# Patient Record
Sex: Female | Born: 1985 | Hispanic: No | Marital: Married | Smoking: Never smoker
Health system: Southern US, Community
[De-identification: ages and names within clinical notes are randomized; demographics above are authoritative.]

## PROBLEM LIST (undated history)

## (undated) DIAGNOSIS — G43909 Migraine, unspecified, not intractable, without status migrainosus: Secondary | ICD-10-CM

## (undated) HISTORY — DX: Migraine, unspecified, not intractable, without status migrainosus: G43.909

---

## 2020-12-05 ENCOUNTER — Other Ambulatory Visit: Payer: Self-pay | Admitting: Nurse Practitioner

## 2020-12-05 DIAGNOSIS — Z8679 Personal history of other diseases of the circulatory system: Secondary | ICD-10-CM

## 2021-01-09 ENCOUNTER — Other Ambulatory Visit: Payer: Self-pay

## 2021-01-09 ENCOUNTER — Ambulatory Visit
Admission: RE | Admit: 2021-01-09 | Discharge: 2021-01-09 | Disposition: A | Discharge status: Home / Self Care | Payer: Self-pay | Source: Ambulatory Visit | Attending: Nurse Practitioner | Admitting: Nurse Practitioner

## 2021-01-09 DIAGNOSIS — Z8679 Personal history of other diseases of the circulatory system: Secondary | ICD-10-CM

## 2021-01-09 MED ORDER — GADOBENATE DIMEGLUMINE 529 MG/ML IV SOLN
14.0000 mL | Freq: Once | INTRAVENOUS | Status: AC | PRN
  Administered 2021-01-09: 17:00:00 14 mL via INTRAVENOUS

## 2021-01-17 ENCOUNTER — Ambulatory Visit: Payer: 59 | Attending: Family Medicine | Admitting: Physical Therapy

## 2021-01-17 ENCOUNTER — Other Ambulatory Visit: Payer: Self-pay

## 2021-01-17 ENCOUNTER — Encounter: Payer: Self-pay | Admitting: Physical Therapy

## 2021-01-17 DIAGNOSIS — R278 Other lack of coordination: Secondary | ICD-10-CM | POA: Insufficient documentation

## 2021-01-17 DIAGNOSIS — M25531 Pain in right wrist: Secondary | ICD-10-CM | POA: Insufficient documentation

## 2021-01-17 DIAGNOSIS — R252 Cramp and spasm: Secondary | ICD-10-CM | POA: Insufficient documentation

## 2021-01-17 NOTE — Therapy (Signed)
[facility] @ [facility] [address] [place], [place], [address] Phone: [phone]   Fax:  [phone]  Physical Therapy Evaluation  Patient Details  Name: Robin Cooper MRN: [id number] Date of Birth: 1985/07/09 Referring Provider (PT): Lewis Moccasin, MD   Encounter Date: 01/17/2021   PT End of Session - 01/17/21 1705     Visit Number 1    Date for PT Re-Evaluation 04/11/21    Authorization Type [employer]    PT Start Time 1623    PT Stop Time 1700    PT Time Calculation (min) 37 min    Activity Tolerance Patient tolerated treatment well    Behavior During Therapy [facility] for tasks assessed/performed             No past medical history on file.  Past Surgical History:  Procedure Laterality Date   CESAREAN SECTION      There were no vitals filed for this visit.    Subjective Assessment - 01/17/21 1628     Subjective Had c-section in May and was having tingling in the incision that comes and goes.  Having leakage with coughing and sneezing. I am also having dull wrist pain that comes and goes.    Patient Stated Goals stop leakage and no have pain in wrist    Currently in Pain? No/denies                [facility] PT Assessment - 01/18/21 0001       Assessment   Medical Diagnosis N99.89 (ICD-10-CM) - Other postprocedural complications and disorders of genitourinary system;N39.3 (ICD-10-CM) - Stress incontinence (female) (female    Referring Provider (PT) Lewis Moccasin, MD    Hand Dominance Right      Precautions   Precautions None      Balance Screen   Has the patient fallen in the past 6 months No      Home Environment   Living Environment Private residence      Prior Function   Level of Independence Independent    Vocation Full time employment    Vocation Requirements sitting at a desk      Cognition   Overall Cognitive Status Within Functional Limits for tasks assessed      Functional Tests    Functional tests Single leg stance      Single Leg Stance   Comments normal      Posture/Postural Control   Posture/Postural Control Postural limitations    Postural Limitations Increased thoracic kyphosis;Anterior pelvic tilt      ROM / Strength   AROM / PROM / Strength AROM;PROM;Strength      AROM   Overall AROM Comments lumbar flexion 60^      PROM   Overall PROM Comments hip flexion and IR 70% bil      Strength   Overall Strength Comments bil hip abduction, adduction, ext - 4/5; core weakness with diastasis of 1 finger      Flexibility   Soft Tissue Assessment /Muscle Length yes    Hamstrings 80% bil      Palpation   Palpation comment DRA 1 finger throughout      Ambulation/Gait   Gait Pattern Within Functional Limits                         Objective measurements completed on examination: See above findings.     Pelvic Floor Special Questions - 01/18/21 0001  Are you Pregnant or attempting pregnancy? No    Number of Pregnancies 1    Number of C-Sections 1    Currently Sexually Active Yes    Is this Painful No    Urinary Leakage Yes    How often a little    Pad use liner    Urinary urgency No    Urinary frequency normal    Fecal incontinence No    Falling out feeling (prolapse) No    External Palpation little to no movement palpated outside clothing for patient comfort    Exam Type Deferred                        PT Education - 01/17/21 1722     Education Details Access Code: [email or web address]    Person(s) Educated Patient    Methods Explanation;Demonstration;Tactile cues;Handout;Verbal cues    Comprehension Verbalized understanding;Returned demonstration                 PT Long Term Goals - 01/18/21 0827       PT LONG TERM GOAL #1   Title Pt will be ind with advanced HEP    Time 12    Period Weeks    Status New    Target Date 04/11/21      PT LONG TERM GOAL #2   Title Pt will report no leakage and not  needing to use panty liners due to improved pelvic floor strength    Time 12    Period Weeks    Status New    Target Date 04/11/21      PT LONG TERM GOAL #3   Title Pt will be able to bulge and relax the pelvic floor muscles for improved muscle function    Time 12    Period Weeks    Status New    Target Date 04/11/21      PT LONG TERM GOAL #4   Title Pt will report at least 50% reduced abdominal discomfort so she can return to regular exercise routine.    Time 12    Period Weeks    Status New    Target Date 04/11/21                    Plan - 01/17/21 1707     Clinical Impression Statement Pt presents to clinic due to stress incontinence and feeling some discomfort in abdomen and Rt wrist.  Pt is s/p c-section May 2022.  She has recently moved from [place] after the baby was born.  Pt has a lot of limited mobility in bil h/s, hip flexion and IR, and lumbar flexion all limited.  Pt has weakness in bil hip abduction, adduction and extension.  She has slight diastasis rectus abdominus.  Pt deferred an internal assessment today but external palpation was done to assess and she was unable to bulge or contract much.  Pt has tenderness to the Rt flexor digitorum muscles.  pt will benefit from skilled PT to address all the above mentioned impairments and return to maximum function.    Personal Factors and Comorbidities Comorbidity 1    Comorbidities c-section    Examination-Activity Limitations Continence    Examination-Participation Restrictions Community Activity;Occupation    Stability/Clinical Decision Making Stable/Uncomplicated    Clinical Decision Making Low    Rehab Potential Excellent    PT Frequency 1x / week    PT Duration 12 weeks    PT Treatment/Interventions  ADLs/Self Care Home Management;Biofeedback;Cryotherapy;Electrical Stimulation;Moist Heat;Neuromuscular re-education;Therapeutic exercise;Therapeutic activities;Patient/family education;Manual techniques;Dry  needling;Passive range of motion;Taping    PT Next Visit Plan DN to lumbar maybe or STM, core strength, review intial stretches and breathing and bulging, STM to gluteals    PT Home Exercise Plan Access Code: [email or web address]    Consulted and Agree with Plan of Care Patient             Patient will benefit from skilled therapeutic intervention in order to improve the following deficits and impairments:  Decreased coordination, Decreased strength, Postural dysfunction, Pain, Decreased range of motion  Visit Diagnosis: Cramp and spasm  Other lack of coordination  Pain in right wrist     Problem List There are no problems to display for this patient.   Junious Silk, PT 01/18/2021, 10:20 AM  [facility] @ [facility] [address] [place], [place], [address] Phone: [phone]   Fax:  [phone]  Name: Robin Cooper MRN: [id number] Date of Birth: 1985-07-27

## 2021-01-17 NOTE — Patient Instructions (Signed)
Access Code: [email or web address] URL: [email or web address] Date: 01/17/2021 Prepared by: Dwana Curd  Exercises Supine Lower Trunk Rotation - 1 x daily - 7 x weekly - 1 sets - 10 reps - 5 sec hold Supine Hip Internal and External Rotation - 1 x daily - 7 x weekly - 1 sets - 10 reps - 5 sec hold Child's Pose Stretch - 1 x daily - 7 x weekly - 1 sets - 3 reps - 30-60 hold  Patient Education Trigger Point Dry Needling

## 2021-01-18 ENCOUNTER — Ambulatory Visit: Payer: 59 | Admitting: Physical Therapy

## 2023-02-28 ENCOUNTER — Encounter: Payer: Self-pay | Admitting: Neurology

## 2023-07-04 ENCOUNTER — Ambulatory Visit: Payer: 59 | Admitting: Neurology

## 2023-07-09 NOTE — Progress Notes (Unsigned)
 NEUROLOGY CONSULTATION NOTE  Robin Cooper MRN: [id number] DOB: Dec 26, 1985  Referring provider: Rogene Claude, MD Primary care provider: Rayma Calandra, MD  Reason for consult:  migraines  Assessment/Plan:   Menstrual migraine History of PRES during childbirth   Will try perimenstrual prophylaxis:  Samples of Nurtec - take 1 tablet daily for 2 weeks beginning one week prior to menses.  She will give me an update. Migraine rescue:  ibuprofen Limit use of pain relievers to no more than 9 days out of the month to prevent risk of rebound or medication-overuse headache. Keep headache diary Follow up 6 months.   Total time spent in chart and face to face with patient:  45 minutes   Subjective:  Robin Cooper is a 38 year old right-handed female who presents for migraines.  History supplemented by referring provider's note.  MRI of brain personally reviewed.  Onset:  Since childbirth in May 2022.  Pregnancy was complicated on day of delivery by hypertensive emergency with PRES syndrome and subdural hematoma.  Of note, she was found to have COVID when admitted to the hospital.  Headaches lasted for 2 weeks after delivery. Location:  varies - right or left sided, radiate into jaw/teeth Quality:  pressure, sometimes pounding Intensity:  Varies.  Usually 5-7/10, 8/10 at worse Aura:  absent Prodrome:  absent Associated symptoms:  Photophobia, difficulty focusing, rarely nausea .  She denies associated vomiting, phonophobia, visual symptoms, unilateral numbness or weakness. Duration:  4 days to 1 week (intermittent or persistent)  Frequency:  every month beginning 1 week to 2 days before prior to onset of menses. Frequency of abortive medication: no more than a week Triggers:  menstrual cycle Relieving factors:  Advil Activity:  movement aggravates  06/23/2020:  CT HEAD:  Abnormal attenuation in the occipital lobes and cerebellar hemispheres bilaterally is concerning for  acute to subacute ischemia.  Correlation with possible PRESS.  A punctate area of intra-axial hemorrhage in the left cerebellar hemisphere is not excluded. 06/24/2020 MRV BRAIN/HEAD WO:  Absence of flow-related signal on the reconstructed images in the posterior sagittal sinus favored to be artifactual given the recent normal CT venogram.   01/09/2021 MRI BRAIN WO:  Normal examination.  Complete resolution of previous para falcine hematoma without imaging residua.  Past NSAIDS/analgesics:  Tylenol Extra-strength Past abortive triptans:  none Past abortive ergotamine:  none Past muscle relaxants:  none Past anti-emetic:  none Past antihypertensive medications:  for a short period after during and after delivery of her son Past antidepressant medications:  none Past anticonvulsant medications:  none Past anti-CGRP:  none Past vitamins/Herbal/Supplements:  none Past antihistamines/decongestants:  none Other past therapies:  none  Current NSAIDS/analgesics:  ibuprofen Current triptans:  none Current ergotamine:  none Current anti-emetic:  none Current muscle relaxants:  none Current Antihypertensive medications:  none Current Antidepressant medications:  none Current Anticonvulsant medications:  none Current anti-CGRP:  none Current Vitamins/Herbal/Supplements:  MVI Current Antihistamines/Decongestants:  none Other therapy:  none Birth control:  none    Caffeine:  1 to 2 cups coffee daily.  No soda or energy drinks Alcohol:  no more than 1-2 glasses wine a week Smoker:  no Diet:  at least 50-60 oz water daily.  Protein, vegetables, trying to reduce carbs, limits eating out.   Exercise:  routine prior to pregnancy.  Not routine caring for her toddler.  Depression:  no; Anxiety:  yes.  Work and home-related stress Sleep hygiene:  good.  But may need to take  out her senior dog at night.  Generally gets at least 5 hours solid sleep (7 hours on a good night)  History of head  trauma/concussion:  no Family history of headache:  no Family history of aneurysm:  no      PAST MEDICAL HISTORY: Past Medical History:  Diagnosis Date   Migraines     PAST SURGICAL HISTORY: Past Surgical History:  Procedure Laterality Date   CESAREAN SECTION      MEDICATIONS: Current Outpatient Medications on File Prior to Visit  Medication Sig Dispense Refill   ibuprofen (ADVIL) 200 MG tablet Take 200 mg by mouth every 6 (six) hours as needed for headache.     Multiple Vitamin (MULTIVITAMIN) tablet Take 1 tablet by mouth daily.     No current facility-administered medications on file prior to visit.     ALLERGIES: No Known Allergies  FAMILY HISTORY: History reviewed. No pertinent family history.  Objective:  Blood pressure 95/61, pulse 66, height 5' 4 (1.626 m), weight 150 lb (68 kg), SpO2 98%. General: No acute distress.  Patient appears well-groomed.   Head:  Normocephalic/atraumatic Eyes:  fundi examined but not visualized Neck: supple, no paraspinal tenderness, full range of motion Back: No paraspinal tenderness Heart: regular rate and rhythm Neurological Exam: Mental status: alert and oriented to person, place, and time, speech fluent and not dysarthric, language intact. Cranial nerves: CN I: not tested CN II: pupils equal, round and reactive to light, visual fields intact CN III, IV, VI:  full range of motion, no nystagmus, no ptosis CN V: facial sensation intact. CN VII: upper and lower face symmetric CN VIII: hearing intact CN IX, X: gag intact, uvula midline CN XI: sternocleidomastoid and trapezius muscles intact CN XII: tongue midline Bulk & Tone: normal, no fasciculations. Motor:  muscle strength 5/5 throughout Sensation:  Pinprick, temperature and vibratory sensation intact. Deep Tendon Reflexes:  2+ throughout,  toes downgoing.   Finger to nose testing:  Without dysmetria.   Heel to shin:  Without dysmetria.   Gait:  Normal station and  stride.  Romberg negative.    Thank you for allowing me to take part in the care of this patient.  Janne Members, DO  CC:  Rayma Calandra, MD  Rogene Claude, MD

## 2023-07-11 ENCOUNTER — Ambulatory Visit (INDEPENDENT_AMBULATORY_CARE_PROVIDER_SITE_OTHER): Admitting: Neurology

## 2023-07-11 ENCOUNTER — Ambulatory Visit: Payer: 59 | Admitting: Neurology

## 2023-07-11 ENCOUNTER — Encounter: Payer: Self-pay | Admitting: Neurology

## 2023-07-11 VITALS — BP 95/61 | HR 66 | Ht 64.0 in | Wt 150.0 lb

## 2023-07-11 DIAGNOSIS — G43829 Menstrual migraine, not intractable, without status migrainosus: Secondary | ICD-10-CM | POA: Diagnosis not present

## 2023-07-11 DIAGNOSIS — I6783 Posterior reversible encephalopathy syndrome: Secondary | ICD-10-CM

## 2023-07-11 NOTE — Patient Instructions (Signed)
 Take Nurtec 1 tablet daily for 2 weeks beginning one week prior to first day of your period.

## 2023-09-26 ENCOUNTER — Ambulatory Visit: Admitting: Neurology

## 2023-10-27 IMAGING — MR MR HEAD WO/W CM
13 series · 48 of 48 positions shown · IV contrast (multihance)
Comparison: Report from study performed 12/02/2020.

CLINICAL DATA: Follow-up para falcine subdural hematoma, 6 months
ago after childbirth.

EXAM:
MRI HEAD WITHOUT AND WITH CONTRAST
TECHNIQUE: Multiplanar, multiecho pulse sequences of the brain and surrounding
structures were obtained without and with intravenous contrast.
CONTRAST:  14mL MULTIHANCE GADOBENATE DIMEGLUMINE 529 MG/ML IV SOLN

[Series 5: T1 · sagittal · 4.0mm · 0.75mm/px · 1 of 29 slices shown (1 of 3)]
[im 1/29]
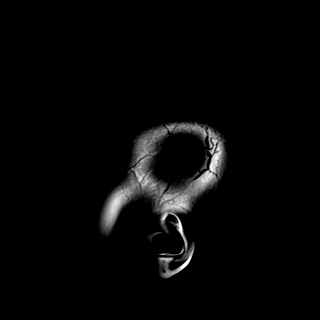

[Series 6: DWI · axial · 3.0mm · 0.94mm/px · z∈[-48,+93]mm · 8 of 163 slices shown (1 of 3)]
[im 1/163]
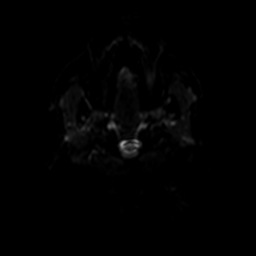
[im 24/163]
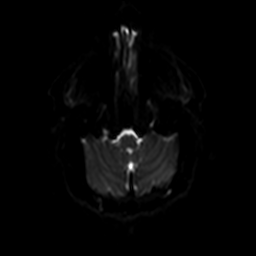
[im 47/163]
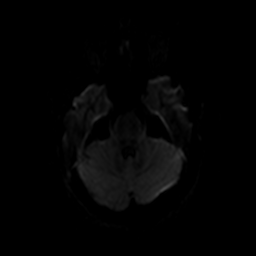
[im 70/163]
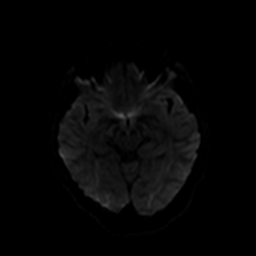
[im 93/163]
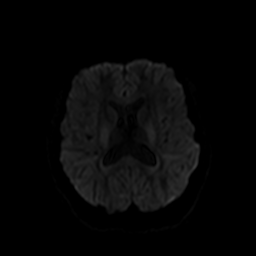
[im 116/163]
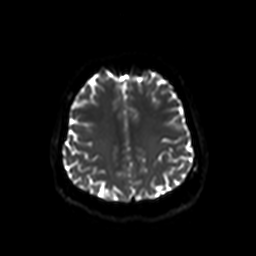
[im 139/163]
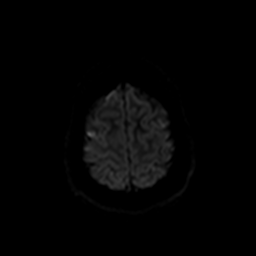
[im 163/163]
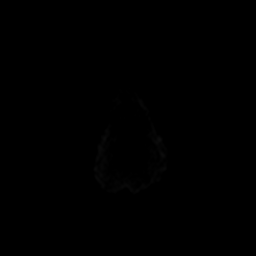

[Series 7: ax dwi_tracew · axial · 3.0mm · 0.94mm/px · z∈[-48,+93]mm · 4 of 81 slices shown]
[im 1/81]
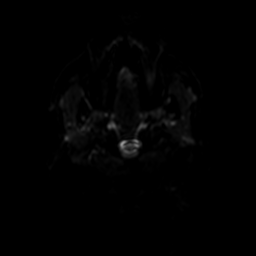
[im 27/81]
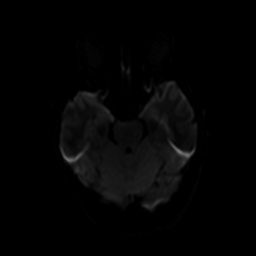
[im 54/81]
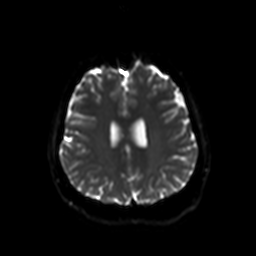
[im 81/81]
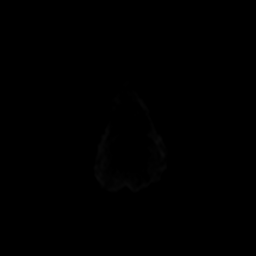

[Series 8: ax dwi_adc · axial · 3.0mm · 0.94mm/px · z∈[-48,+93]mm · 2 of 41 slices shown]
[im 1/41]
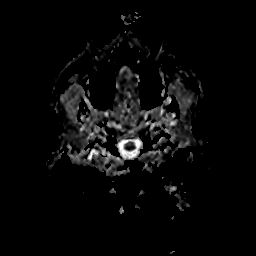
[im 41/41]
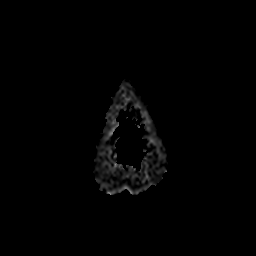

[Series 9: DWI · coronal · 5.0mm · 1.44mm/px · 3 of 60 slices shown (2 of 3)]
[im 1/60]
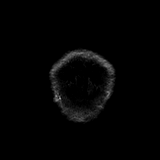
[im 30/60]
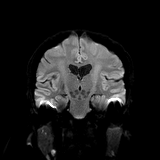
[im 60/60]
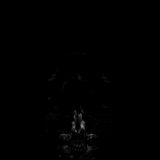

[Series 10: DWI · coronal · 5.0mm · 1.44mm/px · 2 of 30 slices shown (3 of 3)]
[im 1/30]
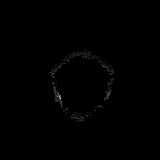
[im 30/30]
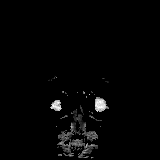

[Series 11: T2 · axial · 4.0mm · 0.36mm/px · z∈[-51,+91]mm · 2 of 29 slices shown]
[im 1/29]
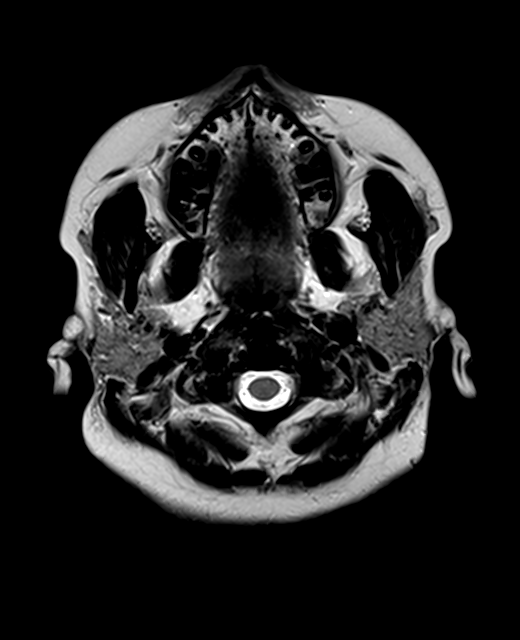
[im 29/29]
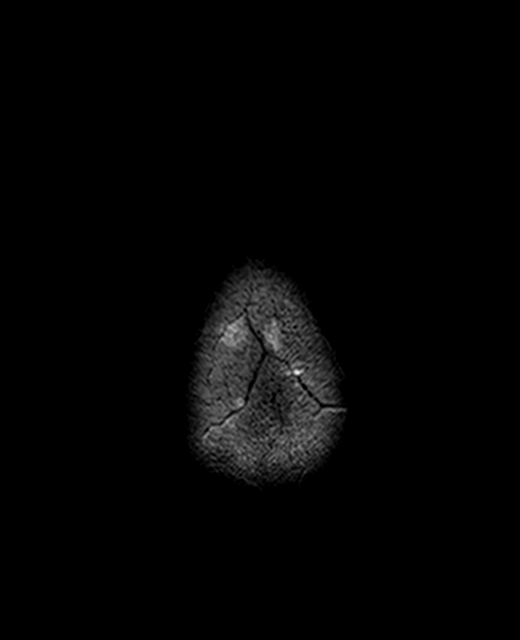

[Series 12: FLAIR · axial · 3.0mm · 0.72mm/px · 1 of 26 slices shown]
[im 1/26]
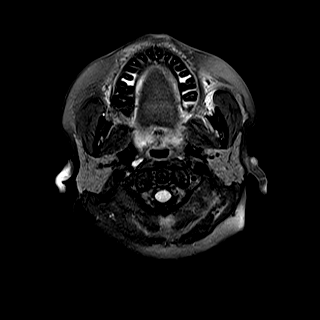

[Series 13: swi_images · axial · 1.5mm · 0.90mm/px · z∈[-50,+90]mm · 5 of 96 slices shown]
[im 1/96]
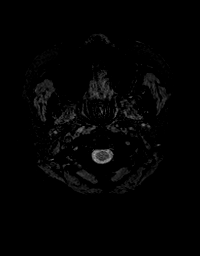
[im 24/96]
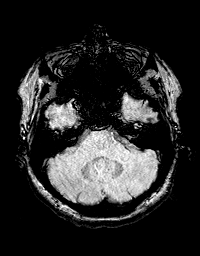
[im 48/96]
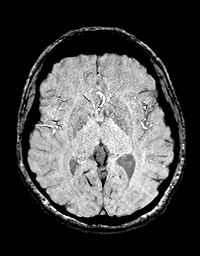
[im 72/96]
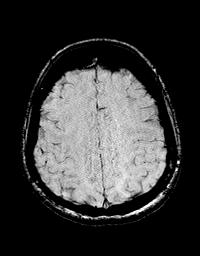
[im 96/96]
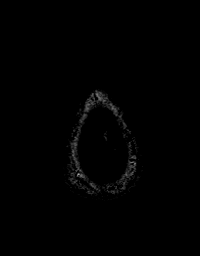

[Series 15: T1 · axial · 1.0mm · 0.94mm/px · z∈[-49,+91]mm · 8 of 144 slices shown (2 of 3)]
[im 1/144]
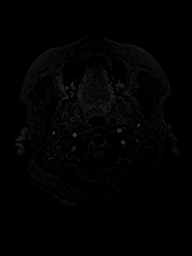
[im 21/144]
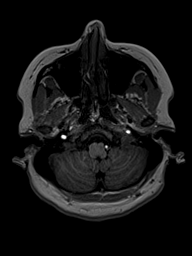
[im 41/144]
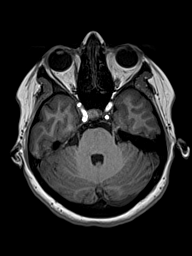
[im 62/144]
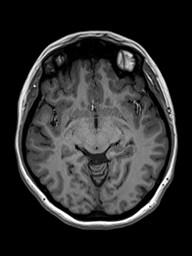
[im 82/144]
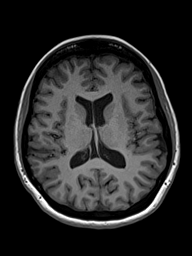
[im 103/144]
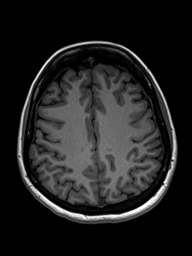
[im 123/144]
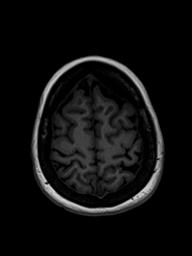
[im 144/144]
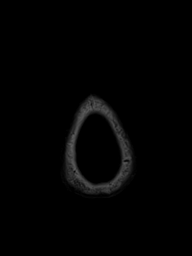

[Series 16: T2 post-contrast · coronal · 4.0mm · 0.36mm/px · 2 of 32 slices shown]
[im 1/32]
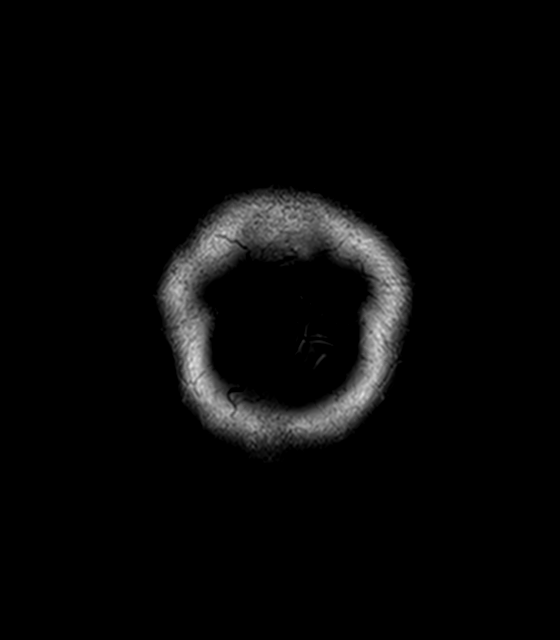
[im 32/32]
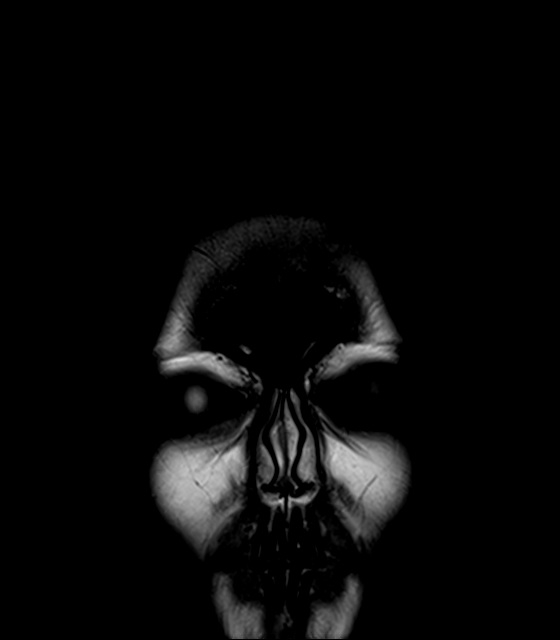

[Series 17: T1 · axial · 1.0mm · 0.94mm/px · z∈[-49,+91]mm · 8 of 144 slices shown (3 of 3)]
[im 1/144]
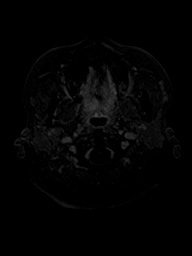
[im 21/144]
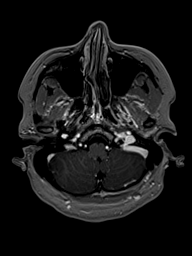
[im 41/144]
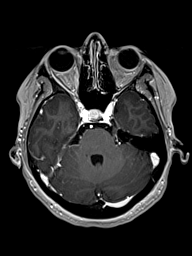
[im 62/144]
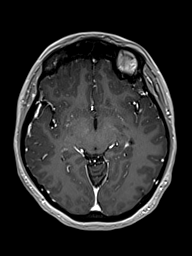
[im 82/144]
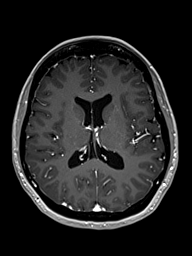
[im 103/144]
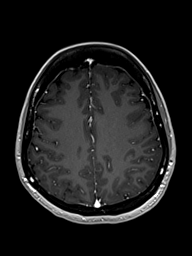
[im 123/144]
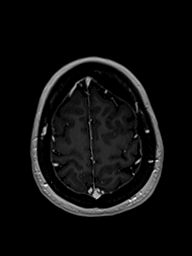
[im 144/144]
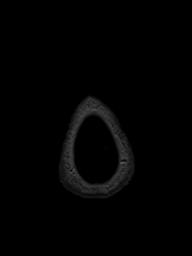

[Series 18: T1 post-contrast · coronal · 4.0mm · 0.72mm/px · 2 of 32 slices shown]
[im 1/32]
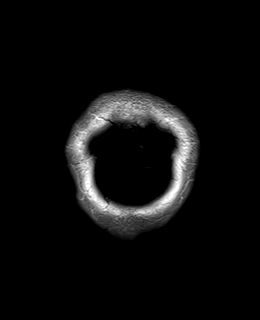
[im 32/32]
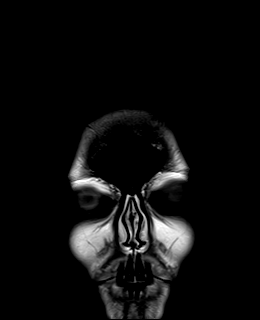

[48 of 48 positions shown; findings below may reference images not displayed]

FINDINGS: Brain: The brain has a normal appearance without evidence of
malformation, atrophy, old or acute small or large vessel
infarction, mass lesion, hemorrhage, hydrocephalus or extra-axial
collection. No residual subdural fluid or blood. After contrast
administration, no abnormal enhancement occurs.

Vascular: Major vessels at the base of the brain show flow. Venous
sinuses appear patent.

Skull and upper cervical spine: Normal.

Sinuses/Orbits: Clear/normal.

Other: None significant.
IMPRESSION: Normal examination. Complete resolution of previous para falcine
hematoma without imaging residua.

## 2024-02-10 ENCOUNTER — Ambulatory Visit: Admitting: Neurology
